# Patient Record
Sex: Female | Born: 1985 | Race: White | Hispanic: No | Marital: Single | Smoking: Never smoker
Health system: Southern US, Community
[De-identification: ages and names within clinical notes are randomized; demographics above are authoritative.]

## PROBLEM LIST (undated history)

## (undated) DIAGNOSIS — L732 Hidradenitis suppurativa: Secondary | ICD-10-CM

## (undated) DIAGNOSIS — R000 Tachycardia, unspecified: Secondary | ICD-10-CM

## (undated) DIAGNOSIS — F419 Anxiety disorder, unspecified: Secondary | ICD-10-CM

## (undated) DIAGNOSIS — I10 Essential (primary) hypertension: Secondary | ICD-10-CM

## (undated) DIAGNOSIS — E282 Polycystic ovarian syndrome: Secondary | ICD-10-CM

## (undated) DIAGNOSIS — E119 Type 2 diabetes mellitus without complications: Secondary | ICD-10-CM

## (undated) DIAGNOSIS — F4310 Post-traumatic stress disorder, unspecified: Secondary | ICD-10-CM

## (undated) DIAGNOSIS — F32A Depression, unspecified: Secondary | ICD-10-CM

## (undated) DIAGNOSIS — E039 Hypothyroidism, unspecified: Secondary | ICD-10-CM

## (undated) DIAGNOSIS — M797 Fibromyalgia: Secondary | ICD-10-CM

## (undated) DIAGNOSIS — G90A Postural orthostatic tachycardia syndrome (POTS): Secondary | ICD-10-CM

## (undated) DIAGNOSIS — E7800 Pure hypercholesterolemia, unspecified: Secondary | ICD-10-CM

## (undated) HISTORY — PX: THYROID LOBECTOMY: SHX420

---

## 2021-10-07 ENCOUNTER — Other Ambulatory Visit: Payer: Self-pay

## 2021-10-07 ENCOUNTER — Ambulatory Visit
Admission: EM | Admit: 2021-10-07 | Discharge: 2021-10-07 | Disposition: A | Discharge status: Home / Self Care | Payer: Medicare Other | Attending: Physician Assistant | Admitting: Physician Assistant

## 2021-10-07 ENCOUNTER — Ambulatory Visit (INDEPENDENT_AMBULATORY_CARE_PROVIDER_SITE_OTHER): Payer: Medicare Other

## 2021-10-07 DIAGNOSIS — Z87442 Personal history of urinary calculi: Secondary | ICD-10-CM | POA: Insufficient documentation

## 2021-10-07 DIAGNOSIS — M545 Low back pain, unspecified: Secondary | ICD-10-CM | POA: Diagnosis present

## 2021-10-07 DIAGNOSIS — R109 Unspecified abdominal pain: Secondary | ICD-10-CM | POA: Diagnosis not present

## 2021-10-07 DIAGNOSIS — R82998 Other abnormal findings in urine: Secondary | ICD-10-CM | POA: Insufficient documentation

## 2021-10-07 DIAGNOSIS — B379 Candidiasis, unspecified: Secondary | ICD-10-CM | POA: Insufficient documentation

## 2021-10-07 HISTORY — DX: Hypothyroidism, unspecified: E03.9

## 2021-10-07 HISTORY — DX: Anxiety disorder, unspecified: F41.9

## 2021-10-07 HISTORY — DX: Depression, unspecified: F32.A

## 2021-10-07 HISTORY — DX: Essential (primary) hypertension: I10

## 2021-10-07 HISTORY — DX: Type 2 diabetes mellitus without complications: E11.9

## 2021-10-07 HISTORY — DX: Postural orthostatic tachycardia syndrome (POTS): G90.A

## 2021-10-07 HISTORY — DX: Tachycardia, unspecified: R00.0

## 2021-10-07 HISTORY — DX: Post-traumatic stress disorder, unspecified: F43.10

## 2021-10-07 HISTORY — DX: Pure hypercholesterolemia, unspecified: E78.00

## 2021-10-07 HISTORY — DX: Hidradenitis suppurativa: L73.2

## 2021-10-07 HISTORY — DX: Fibromyalgia: M79.7

## 2021-10-07 HISTORY — DX: Polycystic ovarian syndrome: E28.2

## 2021-10-07 LAB — URINALYSIS, ROUTINE W REFLEX MICROSCOPIC
Bilirubin Urine: NEGATIVE
Glucose, UA: NEGATIVE mg/dL
Hgb urine dipstick: NEGATIVE
Ketones, ur: NEGATIVE mg/dL
Leukocytes,Ua: NEGATIVE
Nitrite: NEGATIVE
Protein, ur: NEGATIVE mg/dL
Specific Gravity, Urine: 1.025 (ref 1.005–1.030)
pH: 6 (ref 5.0–8.0)

## 2021-10-07 LAB — WET PREP, GENITAL
Clue Cells Wet Prep HPF POC: NONE SEEN
Sperm: NONE SEEN
Trich, Wet Prep: NONE SEEN
WBC, Wet Prep HPF POC: <10 — AB (ref <10)

## 2021-10-07 MED ORDER — CYCLOBENZAPRINE HCL 10 MG PO TABS: 10.0000 mg | ORAL_TABLET | Freq: Three times a day (TID) | ORAL | 0 refills | Status: AC | PRN

## 2021-10-07 MED ORDER — FLUCONAZOLE 150 MG PO TABS: ORAL_TABLET | ORAL | 0 refills | Status: DC

## 2021-10-07 NOTE — Discharge Instructions (Addendum)

## 2021-10-07 NOTE — ED Triage Notes (Signed)
Patient presents to Urgent Care with complaints of left lower back pain, nausea, and possible UTI. She states urinary urgency, dark cloudy urine x 1 week. Pt states hx of kidney stones. She has been on cipro for the past 5 days. Treating pain with Vicodin, last dose this morning.  ? ?Denies hematuria.  ?

## 2021-10-07 NOTE — ED Provider Notes (Signed)
?[facility] ? ? ? ?CSN: [id number] ?Arrival date & time: 10/07/21  1728 ? ? ?  ? ?History   ?Chief Complaint ?Chief Complaint  ?Patient presents with  ? Back Pain  ? ? ?HPI ?Brianna Mejia is a 36 y.o. female presenting for 1 week history of left lower back pain and feeling nauseous.  She reports some urinary urgency and dark cloudy urine.  States she contacted her PCP and let them know her symptoms and with her history of kidney stones they prescribed her an antibiotic for possible UTI.  She has taken 5-day course of Cipro.  Patient also took a Vicodin today for pain relief.  She denies any associated fevers.  The pain is constant and aching and worse when she lays down or rotates the hips or leans forward.  Pain occasionally radiates a little bit toward the left lower abdomen.  Not associated with any vomiting, diarrhea or constipation.  Patient believes she may have a kidney stone.  He says last kidney stone she had was last year.  Denies ever having to have a procedure to remove the stone.  States she is passed on her own.  Medical history significant for type 2 diabetes, fibromyalgia, hypertension, hyperlipidemia, PCOS, POTS, PTSD, depression and obesity. ? ?HPI ? ?Past Medical History:  ?Diagnosis Date  ? Anxiety   ? Depression   ? Diabetes mellitus, type 2 (HCC)   ? Fibromyalgia   ? Hidradenitis   ? High cholesterol   ? Hypertension   ? Hypothyroid   ? PCOS (polycystic ovarian syndrome)   ? POTS (postural orthostatic tachycardia syndrome)   ? PTSD (post-traumatic stress disorder)   ? Sinus tachycardia   ? ? ?There are no problems to display for this patient. ? ? ?Past Surgical History:  ?Procedure Laterality Date  ? THYROID LOBECTOMY    ? ? ?OB History   ?No obstetric history on file. ?  ? ? ? ?Home Medications   ? ?Prior to Admission medications   ?Medication Sig Start Date End Date Taking? Authorizing Provider  ?cefdinir (OMNICEF) 300 MG capsule cefdinir 300 mg capsule ? 300MG  TWICE DAILY 03/18/21   Yes [provider]  ?Cholecalciferol 1.25 MG (50000 UT) capsule cholecalciferol (vitamin D3) 1,250 mcg (50,000 unit) capsule ? TAKE 1 CAPSULE BY MOUTH ONE TIME PER WEEK 05/11/15  Yes [provider]  ?clindamycin (CLEOCIN) 300 MG capsule clindamycin HCl 300 mg capsule ? TAKE 1 CAPSULE BY MOUTH TWO TIMES A DAY. 06/18/21  Yes [provider]  ?cyanocobalamin 1000 MCG tablet cyanocobalamin (vit B-12) 1,000 mcg tablet ? TAKE 1 TABLET BY MOUTH EVERY DAY 05/11/15  Yes [provider]  ?cyclobenzaprine (FLEXERIL) 10 MG tablet Take 1 tablet (10 mg total) by mouth 3 (three) times daily as needed for up to 7 days for muscle spasms. 10/07/21 10/14/21 Yes 10/16/21, PA-C  ?esomeprazole (NEXIUM) 40 MG capsule esomeprazole magnesium 40 mg capsule,delayed release ? TAKE 1 CAPSULE BY MOUTH EVERY DAY 09/06/10  Yes [provider]  ?fluconazole (DIFLUCAN) 150 MG tablet Take 1 tab p.o. every 72 hours for yeast infection 10/07/21  Yes 10/09/21, PA-C  ?gabapentin (NEURONTIN) 300 MG capsule gabapentin 300 mg capsule ? TAKE 3 CAPSULE 3 TIMES A DAY BY ORAL ROUTE FOR 30 DAYS. 10/02/10  Yes [provider]  ?HYDROcodone-Acetaminophen (VICODIN PO)  11/19/10  Yes [provider]  ?ibuprofen (ADVIL) 600 MG tablet Take by mouth. 12/18/10  Yes [provider]  ?  levothyroxine (SYNTHROID) 125 MCG tablet levothyroxine 125 mcg tablet ? TAKE 1 TABLET BY MOUTH EVERY DAY 07/11/19  Yes [provider]  ?LORazepam (ATIVAN) 1 MG tablet lorazepam 1 mg tablet ? TAKE 1 TABLET BY MOUTH 4 TIMES A DAY FOR INCREASED STRESS ABOUT PROCEDURE 05/11/15  Yes [provider]  ?metFORMIN (GLUCOPHAGE-XR) 500 MG 24 hr tablet Take by mouth. 09/06/10  Yes [provider]  ?metoprolol tartrate (LOPRESSOR) 50 MG tablet metoprolol tartrate 50 mg tablet ? TAKE 1 AND 1/2 TABLETS BY MOUTH TWICE A DAY 11/15/10  Yes [provider]  ?Multiple Vitamin (MULTIVITAMIN ADULT  PO) Take 1 tablet by mouth daily. 04/22/05  Yes [provider]  ?promethazine (PHENERGAN) 25 MG tablet promethazine 25 mg tablet ? TAKE 1 TABLET EVERY 6 8 HOURS BY ORAL ROUTE AS NEEDED FOR 30 DAYS. 12/15/16  Yes [provider]  ?spironolactone (ALDACTONE) 100 MG tablet spironolactone 100 mg tablet ? TAKE 1 TABLET BY MOUTH EVERY DAY 03/15/20  Yes [provider]  ?aspirin 81 MG EC tablet Take by mouth.    [provider]  ?atorvastatin (LIPITOR) 20 MG tablet Take 20 mg by mouth daily. 07/21/21   [provider]  ?diltiazem (CARDIZEM CD) 180 MG 24 hr capsule Take 180 mg by mouth daily. 09/11/21   [provider]  ?ergocalciferol (VITAMIN D2) 1.25 MG (50000 UT) capsule ergocalciferol (vitamin D2) 1,250 mcg (50,000 unit) capsule ? TAKE ONE CAPSULE BY MOUTH ONE TIME PER WEEK AS DIRECTED    [provider]  ?FLUoxetine (PROZAC) 10 MG capsule fluoxetine 10 mg capsule ? TAKE 1 CAPSULE EVERY DAY WITH THE 2X20 MG CAPSULES    [provider]  ?FLUoxetine (PROZAC) 20 MG capsule Take 40 mg by mouth daily. 08/24/21   [provider]  ?Hydrocortisone, Perianal, 1 % CREA SMARTSIG:sparingly Topical Twice Daily 05/16/21   [provider]  ?losartan (COZAAR) 25 MG tablet losartan 25 mg tablet ? TAKE 1 TABLET BY MOUTH EVERY DAY    [provider]  ? ? ?Family History ?History reviewed. No pertinent family history. ? ?Social History ?Social History  ? ?Tobacco Use  ? Smoking status: Never  ? Smokeless tobacco: Never  ?Vaping Use  ? Vaping Use: Never used  ?Substance Use Topics  ? Alcohol use: Never  ? Drug use: Never  ? ? ? ?Allergies   ?Meloxicam, Milk protein, Nabumetone, and Pregabalin ? ? ?Review of Systems ?Review of Systems  ?Constitutional:  Negative for chills, fatigue and fever.  ?Gastrointestinal:  Positive for abdominal pain and nausea. Negative for diarrhea and vomiting.  ?Genitourinary:  Positive for flank pain and urgency.  Negative for decreased urine volume, dysuria, frequency, hematuria, pelvic pain, vaginal bleeding, vaginal discharge and vaginal pain.  ?Musculoskeletal:  Positive for back pain.  ?Skin:  Negative for rash.  ? ? ?Physical Exam ?Triage Vital Signs ?ED Triage Vitals  ?Enc Vitals Group  ?   BP 10/07/21 1833 (!) 155/100  ?   Pulse Rate 10/07/21 1833 85  ?   Resp 10/07/21 1833 18  ?   Temp 10/07/21 1833 98.8 ?F (37.1 ?C)  ?   Temp Source 10/07/21 1833 Oral  ?   SpO2 10/07/21 1833 96 %  ?   Weight --   ?   Height --   ?   Head Circumference --   ?   Peak Flow --   ?   Pain Score 10/07/21 1831 3  ?   Pain  Loc --   ?   Pain Edu? --   ?   Excl. in GC? --   ? ?No data found. ? ?Updated Vital Signs ?BP (!) 155/100 (BP Location: Left Arm)   Pulse 85   Temp 98.8 ?F (37.1 ?C) (Oral)   Resp 18   SpO2 96%  ? ?   ? ?Physical Exam ?Vitals and nursing note reviewed.  ?Constitutional:   ?   General: She is not in acute distress. ?   Appearance: Normal appearance. She is not ill-appearing or toxic-appearing.  ?HENT:  ?   Head: Normocephalic and atraumatic.  ?Eyes:  ?   General: No scleral icterus.    ?   Right eye: No discharge.     ?   Left eye: No discharge.  ?   Conjunctiva/sclera: Conjunctivae normal.  ?Cardiovascular:  ?   Rate and Rhythm: Normal rate and regular rhythm.  ?   Heart sounds: Normal heart sounds.  ?Pulmonary:  ?   Effort: Pulmonary effort is normal. No respiratory distress.  ?   Breath sounds: Normal breath sounds.  ?Abdominal:  ?   Palpations: Abdomen is soft.  ?   Tenderness: There is no abdominal tenderness. There is left CVA tenderness. There is no right CVA tenderness.  ?Musculoskeletal:  ?   Cervical back: Neck supple.  ?   Comments: Diffuse tenderness to palpation of the left and right paralumbar muscles and gluteus muscles.  Reduced range of motion of back due to pain and guarding.  ?Skin: ?   General: Skin is dry.  ?Neurological:  ?   General: No focal deficit present.  ?   Mental Status: She is alert.  Mental status is at baseline.  ?   Motor: No weakness.  ?   Gait: Gait normal.  ?Psychiatric:     ?   Mood and Affect: Mood normal.     ?   Behavior: Behavior normal.     ?   Thought Content: Thought content nor

## 2022-05-30 ENCOUNTER — Ambulatory Visit
Admission: EM | Admit: 2022-05-30 | Discharge: 2022-05-30 | Disposition: A | Discharge status: Home / Self Care | Payer: Medicare Other | Attending: Emergency Medicine | Admitting: Emergency Medicine

## 2022-05-30 DIAGNOSIS — R3915 Urgency of urination: Secondary | ICD-10-CM | POA: Diagnosis not present

## 2022-05-30 DIAGNOSIS — J029 Acute pharyngitis, unspecified: Secondary | ICD-10-CM | POA: Insufficient documentation

## 2022-05-30 DIAGNOSIS — J069 Acute upper respiratory infection, unspecified: Secondary | ICD-10-CM | POA: Diagnosis present

## 2022-05-30 DIAGNOSIS — Z1152 Encounter for screening for COVID-19: Secondary | ICD-10-CM | POA: Insufficient documentation

## 2022-05-30 DIAGNOSIS — B3731 Acute candidiasis of vulva and vagina: Secondary | ICD-10-CM | POA: Insufficient documentation

## 2022-05-30 DIAGNOSIS — R0982 Postnasal drip: Secondary | ICD-10-CM | POA: Insufficient documentation

## 2022-05-30 LAB — URINALYSIS, ROUTINE W REFLEX MICROSCOPIC
Glucose, UA: NEGATIVE mg/dL
Hgb urine dipstick: NEGATIVE
Ketones, ur: 15 mg/dL — AB
Nitrite: NEGATIVE
Protein, ur: 30 mg/dL — AB
Specific Gravity, Urine: 1.02 (ref 1.005–1.030)
pH: 5.5 (ref 5.0–8.0)

## 2022-05-30 LAB — SARS CORONAVIRUS 2 BY RT PCR: SARS Coronavirus 2 by RT PCR: NEGATIVE

## 2022-05-30 LAB — WET PREP, GENITAL
Clue Cells Wet Prep HPF POC: NONE SEEN
Sperm: NONE SEEN
Trich, Wet Prep: NONE SEEN
WBC, Wet Prep HPF POC: <10 (ref <10)

## 2022-05-30 LAB — URINALYSIS, MICROSCOPIC (REFLEX)

## 2022-05-30 LAB — GROUP A STREP BY PCR: Group A Strep by PCR: NOT DETECTED

## 2022-05-30 MED ORDER — PROMETHAZINE-DM 6.25-15 MG/5ML PO SYRP: 5.0000 mL | ORAL_SOLUTION | Freq: Four times a day (QID) | ORAL | 0 refills | Status: AC | PRN

## 2022-05-30 MED ORDER — FLUCONAZOLE 150 MG PO TABS: 150.0000 mg | ORAL_TABLET | Freq: Every day | ORAL | 0 refills | Status: AC

## 2022-05-30 MED ORDER — BENZONATATE 100 MG PO CAPS: 200.0000 mg | ORAL_CAPSULE | Freq: Three times a day (TID) | ORAL | 0 refills | Status: AC

## 2022-05-30 MED ORDER — IPRATROPIUM BROMIDE 0.06 % NA SOLN: 2.0000 | Freq: Four times a day (QID) | NASAL | 12 refills | Status: AC

## 2022-05-30 NOTE — ED Provider Notes (Signed)
[facility]    CSN: [id number] Arrival date & time: 05/30/22  1346      History   Chief Complaint Chief Complaint  Patient presents with   Sore Throat   Urinary Urgency    HPI Brianna Mejia is a 36 y.o. female.   HPI  36 year old female here for evaluation of respiratory urinary complaints.  Patient reports that yesterday morning she woke up with a sore throat but she has been experiencing nasal congestion with yellow nasal discharge, postnasal drip, and a nonproductive cough.  She denies any fever, ear pain, shortness of breath, or wheezing.  Additionally she has been experiencing urinary frequency for the last couple of days but she denies any dysuria, hematuria, low back pain, vaginal discharge, or vaginal itching.  She states that she recently has started swimming and she also uses the hot tub after water aerobics.  Past Medical History:  Diagnosis Date   Anxiety    Depression    Diabetes mellitus, type 2 (HCC)    Fibromyalgia    Hidradenitis    High cholesterol    Hypertension    Hypothyroid    PCOS (polycystic ovarian syndrome)    POTS (postural orthostatic tachycardia syndrome)    PTSD (post-traumatic stress disorder)    Sinus tachycardia     There are no problems to display for this patient.   Past Surgical History:  Procedure Laterality Date   THYROID LOBECTOMY      OB History   No obstetric history on file.      Home Medications    Prior to Admission medications   Medication Sig Start Date End Date Taking? Authorizing Provider  aspirin 81 MG EC tablet Take by mouth.   Yes [provider]  atorvastatin (LIPITOR) 20 MG tablet Take 20 mg by mouth daily. 07/21/21  Yes [provider]  benzonatate (TESSALON) 100 MG capsule Take 2 capsules (200 mg total) by mouth every 8 (eight) hours. 05/30/22  Yes Becky Augusta, NP  cefdinir (OMNICEF) 300 MG capsule cefdinir 300 mg capsule  300MG  TWICE DAILY 03/18/21  Yes [provider]  Cholecalciferol 1.25 MG (50000 UT) capsule cholecalciferol (vitamin D3) 1,250 mcg (50,000 unit) capsule  TAKE 1 CAPSULE BY MOUTH ONE TIME PER WEEK 05/11/15  Yes [provider]  clindamycin (CLEOCIN) 300 MG capsule clindamycin HCl 300 mg capsule  TAKE 1 CAPSULE BY MOUTH TWO TIMES A DAY. 06/18/21  Yes [provider]  cyanocobalamin 1000 MCG tablet cyanocobalamin (vit B-12) 1,000 mcg tablet  TAKE 1 TABLET BY MOUTH EVERY DAY 05/11/15  Yes [provider]  diltiazem (CARDIZEM CD) 180 MG 24 hr capsule Take 180 mg by mouth daily. 09/11/21  Yes [provider]  ergocalciferol (VITAMIN D2) 1.25 MG (50000 UT) capsule ergocalciferol (vitamin D2) 1,250 mcg (50,000 unit) capsule  TAKE ONE CAPSULE BY MOUTH ONE TIME PER WEEK AS DIRECTED   Yes [provider]  esomeprazole (NEXIUM) 40 MG capsule esomeprazole magnesium 40 mg capsule,delayed release  TAKE 1 CAPSULE BY MOUTH EVERY DAY 09/06/10  Yes [provider]  fluconazole (DIFLUCAN) 150 MG tablet Take 1 tablet (150 mg total) by mouth daily for 2 doses. 05/30/22 06/01/22 Yes 13/6/23, NP  FLUoxetine (PROZAC) 10 MG capsule fluoxetine 10 mg capsule  TAKE 1 CAPSULE EVERY DAY WITH THE 2X20 MG CAPSULES   Yes [provider]  FLUoxetine (PROZAC) 20 MG capsule Take 40 mg by mouth daily. 08/24/21  Yes [provider]  gabapentin (NEURONTIN) 300 MG capsule gabapentin 300 mg capsule  TAKE 3 CAPSULE 3 TIMES A DAY BY ORAL ROUTE FOR 30 DAYS. 10/02/10  Yes [provider]  HYDROcodone-Acetaminophen (VICODIN PO)  11/19/10  Yes [provider]  Hydrocortisone, Perianal, 1 % CREA SMARTSIG:sparingly Topical Twice Daily 05/16/21  Yes [provider]  ibuprofen (ADVIL) 600 MG tablet Take by mouth. 12/18/10  Yes [provider]  ipratropium (ATROVENT) 0.06 % nasal spray Place 2 sprays into both nostrils 4 (four) times daily. 05/30/22  Yes Becky Augusta, NP   levothyroxine (SYNTHROID) 125 MCG tablet levothyroxine 125 mcg tablet  TAKE 1 TABLET BY MOUTH EVERY DAY 07/11/19  Yes [provider]  LORazepam (ATIVAN) 1 MG tablet lorazepam 1 mg tablet  TAKE 1 TABLET BY MOUTH 4 TIMES A DAY FOR INCREASED STRESS ABOUT PROCEDURE 05/11/15  Yes [provider]  losartan (COZAAR) 25 MG tablet losartan 25 mg tablet  TAKE 1 TABLET BY MOUTH EVERY DAY   Yes [provider]  metFORMIN (GLUCOPHAGE-XR) 500 MG 24 hr tablet Take by mouth. 09/06/10  Yes [provider]  metoprolol tartrate (LOPRESSOR) 50 MG tablet metoprolol tartrate 50 mg tablet  TAKE 1 AND 1/2 TABLETS BY MOUTH TWICE A DAY 11/15/10  Yes [provider]  Multiple Vitamin (MULTIVITAMIN ADULT PO) Take 1 tablet by mouth daily. 04/22/05  Yes [provider]  promethazine (PHENERGAN) 25 MG tablet promethazine 25 mg tablet  TAKE 1 TABLET EVERY 6 8 HOURS BY ORAL ROUTE AS NEEDED FOR 30 DAYS. 12/15/16  Yes [provider]  promethazine-dextromethorphan (PROMETHAZINE-DM) 6.25-15 MG/5ML syrup Take 5 mLs by mouth 4 (four) times daily as needed. 05/30/22  Yes Becky Augusta, NP  Semaglutide,0.25 or 0.5MG /DOS, (OZEMPIC, 0.25 OR 0.5 MG/DOSE,) 2 MG/1.5ML SOPN Inject 0.5 mg every week by subcutaneous route for 30 days. 03/05/22  Yes [provider]  spironolactone (ALDACTONE) 100 MG tablet spironolactone 100 mg tablet  TAKE 1 TABLET BY MOUTH EVERY DAY 03/15/20  Yes [provider]  verapamil (CALAN-SR) 180 MG CR tablet  09/06/10  Yes [provider]  Liraglutide -Weight Management (SAXENDA) 18 MG/3ML SOPN INJECT 0.6MG  SUBCUTANEOUS DAILY X 7 DAYS, THEN 1.2MG SQ DAILY X 7DAYS, THEN 1.8 MG SQ DAILY X 7 DAYS, THEN 2.4MG  SQ DAILY    [provider]    Family History History reviewed. No pertinent family history.  Social History Social History   Tobacco Use   Smoking status: Never   Smokeless tobacco: Never  Vaping Use   Vaping Use:  Never used  Substance Use Topics   Alcohol use: Never   Drug use: Never     Allergies   Meloxicam, Milk protein, Nabumetone, Pregabalin, and Cyclobenzaprine   Review of Systems Review of Systems  Constitutional:  Negative for fever.  HENT:  Positive for congestion, postnasal drip, rhinorrhea and sore throat. Negative for ear pain.   Respiratory:  Positive for cough. Negative for shortness of breath and wheezing.   Genitourinary:  Positive for frequency and urgency. Negative for dysuria, hematuria, vaginal discharge and vaginal pain.  Musculoskeletal:  Negative for back pain.     Physical Exam Triage Vital Signs ED Triage Vitals  Enc Vitals Group     BP 05/30/22 1416 129/82     Pulse Rate 05/30/22 1416 73     Resp --      Temp 05/30/22 1416 98.8 F (37.1 C)     Temp Source 05/30/22 1416 Oral     SpO2  05/30/22 1416 98 %     Weight 05/30/22 1415 (!) 332 lb (150.6 kg)     Height 05/30/22 1415 5\' 8"  (1.727 m)     Head Circumference --      Peak Flow --      Pain Score 05/30/22 1415 2     Pain Loc --      Pain Edu? --      Excl. in GC? --    No data found.  Updated Vital Signs BP 129/82 (BP Location: Right Arm)   Pulse 73   Temp 98.8 F (37.1 C) (Oral)   Ht 5\' 8"  (1.727 m)   Wt (!) 332 lb (150.6 kg)   SpO2 98%   BMI 50.48 kg/m   Visual Acuity Right Eye Distance:   Left Eye Distance:   Bilateral Distance:    Right Eye Near:   Left Eye Near:    Bilateral Near:     Physical Exam Vitals and nursing note reviewed.  Constitutional:      Appearance: Normal appearance. She is not ill-appearing.  HENT:     Head: Normocephalic and atraumatic.     Right Ear: Tympanic membrane, ear canal and external ear normal. There is no impacted cerumen.     Left Ear: Tympanic membrane, ear canal and external ear normal. There is no impacted cerumen.     Nose: Congestion and rhinorrhea present.     Comments: Nasal mucosa is erythematous and edematous with clear discharge in  both nares.    Mouth/Throat:     Mouth: Mucous membranes are moist.     Pharynx: Oropharynx is clear. Posterior oropharyngeal erythema present. No oropharyngeal exudate.     Comments: Tonsillar pillars are benign.  Posterior oropharynx is erythematous and injected with clear postnasal drip. Cardiovascular:     Rate and Rhythm: Normal rate and regular rhythm.     Pulses: Normal pulses.     Heart sounds: Normal heart sounds. No murmur heard.    No friction rub. No gallop.  Pulmonary:     Effort: Pulmonary effort is normal.     Breath sounds: Normal breath sounds. No wheezing, rhonchi or rales.  Abdominal:     Tenderness: There is no right CVA tenderness or left CVA tenderness.  Musculoskeletal:     Cervical back: Normal range of motion and neck supple.  Lymphadenopathy:     Cervical: No cervical adenopathy.  Skin:    General: Skin is warm and dry.     Capillary Refill: Capillary refill takes less than 2 seconds.     Findings: No erythema or rash.  Neurological:     General: No focal deficit present.     Mental Status: She is alert and oriented to person, place, and time.  Psychiatric:        Mood and Affect: Mood normal.        Behavior: Behavior normal.        Thought Content: Thought content normal.        Judgment: Judgment normal.      UC Treatments / Results  Labs (all labs ordered are listed, but only abnormal results are displayed) Labs Reviewed  WET PREP, GENITAL - Abnormal; Notable for the following components:      Result Value   Yeast Wet Prep HPF POC PRESENT (*)    All other components within normal limits  URINALYSIS, ROUTINE W REFLEX MICROSCOPIC - Abnormal; Notable for the following components:   APPearance CLOUDY (*)  Bilirubin Urine SMALL (*)    Ketones, ur 15 (*)    Protein, ur 30 (*)    Leukocytes,Ua TRACE (*)    All other components within normal limits  URINALYSIS, MICROSCOPIC (REFLEX) - Abnormal; Notable for the following components:   Bacteria,  UA FEW (*)    All other components within normal limits  GROUP A STREP BY PCR  SARS CORONAVIRUS 2 BY RT PCR  URINE CULTURE    EKG   Radiology No results found.  Procedures Procedures (including critical care time)  Medications Ordered in UC Medications - No data to display  Initial Impression / Assessment and Plan / UC Course  I have reviewed the triage vital signs and the nursing notes.  Pertinent labs & imaging results that were available during my care of the patient were reviewed by me and considered in my medical decision making (see chart for details).   Patient is a nontoxic-appearing 36 year old female here for evaluation of urinary and respiratory complaints as outlined in HPI above.  Her physical exam reveals the presence of an upper respiratory infection, most likely viral given the edema and erythema of her nasal mucosa and the clear rhinorrhea and clear postnasal drip.  She reports that several of her classmates and water aerobics have tested positive for strep so I will order strep PCR.  I will also do COVID PCR.  She has no CVA tenderness on exam but has urinary urgency and frequency.  I will order a vaginal wet prep as well as a urinalysis.  Vaginal wet prep is positive for yeast but negative for trichomoniasis or clue cells.  Group A strep PCR is negative.  Urinalysis shows a cloudy appearance with small bilirubin, 15 ketones, 30 protein, and trace leukocyte esterase.  Negative for nitrites.  Reflex microscopy shows 6-10 WBCs but 11-20 squamous epithelials and few bacteria.  There is also budding yeast present.  This is a contaminated sample and I suspect that the leukocyte esterase may be a result of the budding yeast.  I will send urine for culture however.  COVID PCR is negative.  I will discharge patient home with a diagnosis of vaginal yeast infection and viral upper respiratory infection.  I will treat her symptoms with Atrovent nasal spray to help with the  congestion and postnasal drip, Tessalon Perles and Promethazine DM cough syrup help with cough and congestion, and Diflucan 150 mg tablet now and repeat in 3 days for the vaginal yeast infection.   Final Clinical Impressions(s) / UC Diagnoses   Final diagnoses:  Viral URI with cough  Vaginal yeast infection     Discharge Instructions      You tested negative for COVID today.  You have a viral upper respiratory infection.  Use the Atrovent nasal spray, 2 squirts in each nostril every 6 hours, as needed for runny nose and postnasal drip.  Use the Tessalon Perles every 8 hours during the day.  Take them with a small sip of water.  They may give you some numbness to the base of your tongue or a metallic taste in your mouth, this is normal.  Use the Promethazine DM cough syrup at bedtime for cough and congestion.  It will make you drowsy so do not take it during the day.  For your vaginal yeast infection take 1 Diflucan tablet now and repeat in 72 hours.  We are sending the urine for culture and if any infection grows out we will call you and  prescribe antibiotics at that time.  However, I think your urinary symptoms are coming from your vaginal yeast infection and the Diflucan will treat that in the meantime.  Return for reevaluation or see your primary care provider for any new or worsening symptoms.      ED Prescriptions     Medication Sig Dispense Auth. Provider   benzonatate (TESSALON) 100 MG capsule Take 2 capsules (200 mg total) by mouth every 8 (eight) hours. 21 capsule Becky Augusta, NP   fluconazole (DIFLUCAN) 150 MG tablet Take 1 tablet (150 mg total) by mouth daily for 2 doses. 2 tablet Becky Augusta, NP   ipratropium (ATROVENT) 0.06 % nasal spray Place 2 sprays into both nostrils 4 (four) times daily. 15 mL Becky Augusta, NP   promethazine-dextromethorphan (PROMETHAZINE-DM) 6.25-15 MG/5ML syrup Take 5 mLs by mouth 4 (four) times daily as needed. 118 mL Becky Augusta, NP       PDMP not reviewed this encounter.   Becky Augusta, NP 05/30/22 1555    Becky Augusta, NP 06/04/22 [phone]

## 2022-05-30 NOTE — Discharge Instructions (Addendum)
You tested negative for COVID today.  You have a viral upper respiratory infection.  Use the Atrovent nasal spray, 2 squirts in each nostril every 6 hours, as needed for runny nose and postnasal drip.  Use the Tessalon Perles every 8 hours during the day.  Take them with a small sip of water.  They may give you some numbness to the base of your tongue or a metallic taste in your mouth, this is normal.  Use the Promethazine DM cough syrup at bedtime for cough and congestion.  It will make you drowsy so do not take it during the day.  For your vaginal yeast infection take 1 Diflucan tablet now and repeat in 72 hours.  We are sending the urine for culture and if any infection grows out we will call you and prescribe antibiotics at that time.  However, I think your urinary symptoms are coming from your vaginal yeast infection and the Diflucan will treat that in the meantime.  Return for reevaluation or see your primary care provider for any new or worsening symptoms.

## 2022-05-30 NOTE — ED Triage Notes (Addendum)
Pt c/o sore throat onset yesterday, hurts to swallow onset yesterday   Pt also c/o urinary urgency x few days. Pt also reports she does swim a lot

## 2022-05-31 LAB — URINE CULTURE: Culture: NO GROWTH

## 2022-07-06 ENCOUNTER — Ambulatory Visit: Payer: Medicare Other | Admitting: Internal Medicine

## 2022-07-06 NOTE — Progress Notes (Signed)
Pt did not show for scheduled appointment.  

## 2023-12-10 IMAGING — CR DG ABDOMEN 1V
4 series · 4 of 4 positions shown · non-contrast
Comparison: None.

CLINICAL DATA: Left flank pain.

EXAM:
ABDOMEN - 1 VIEW

[abdomen kub (1 of 4)]
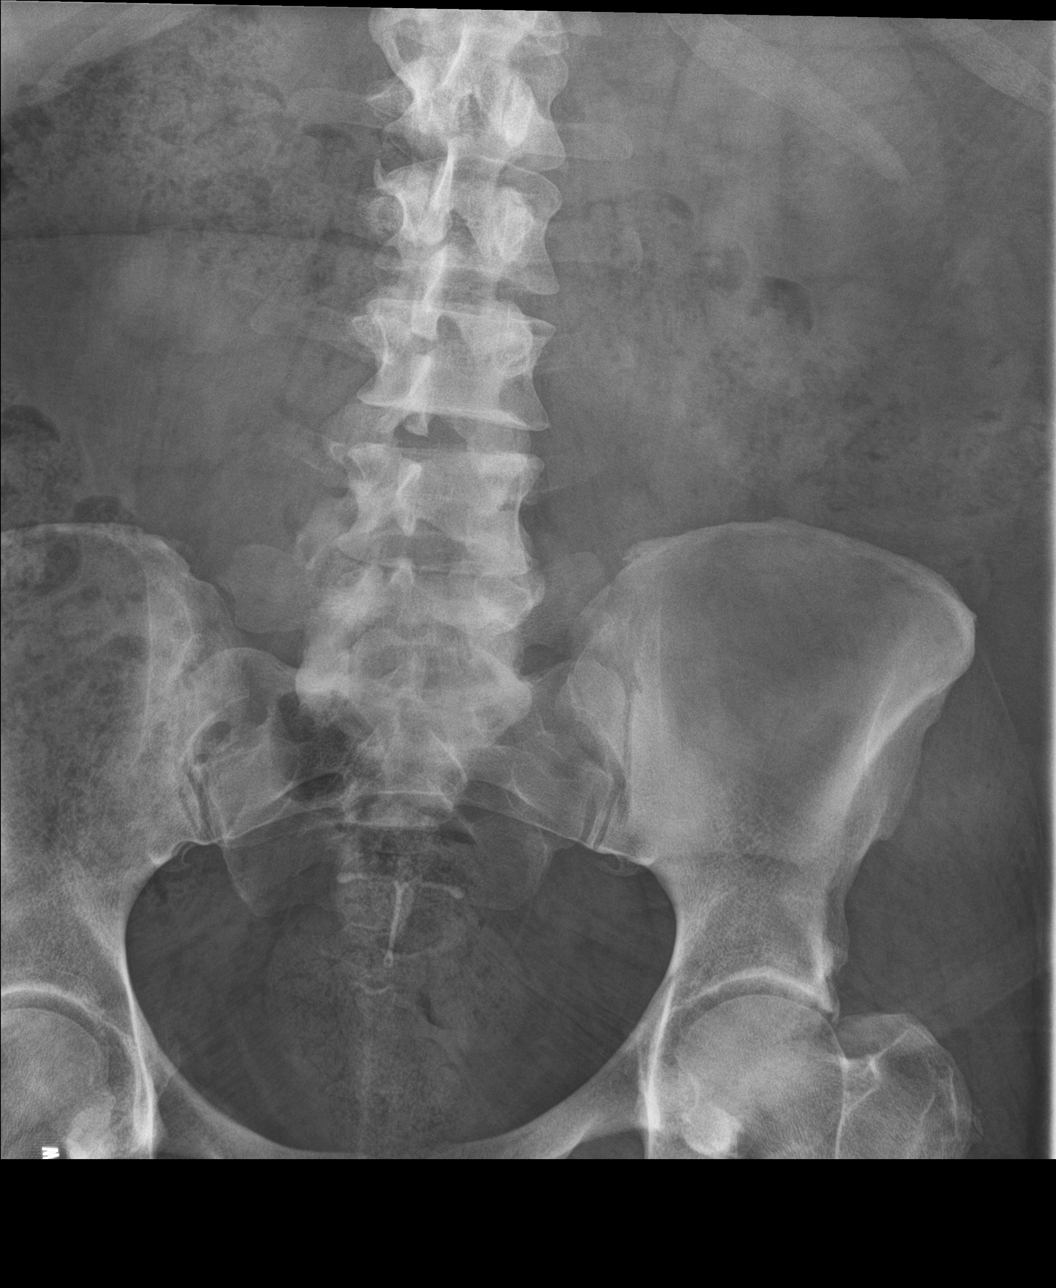

[abdomen kub (2 of 4)]
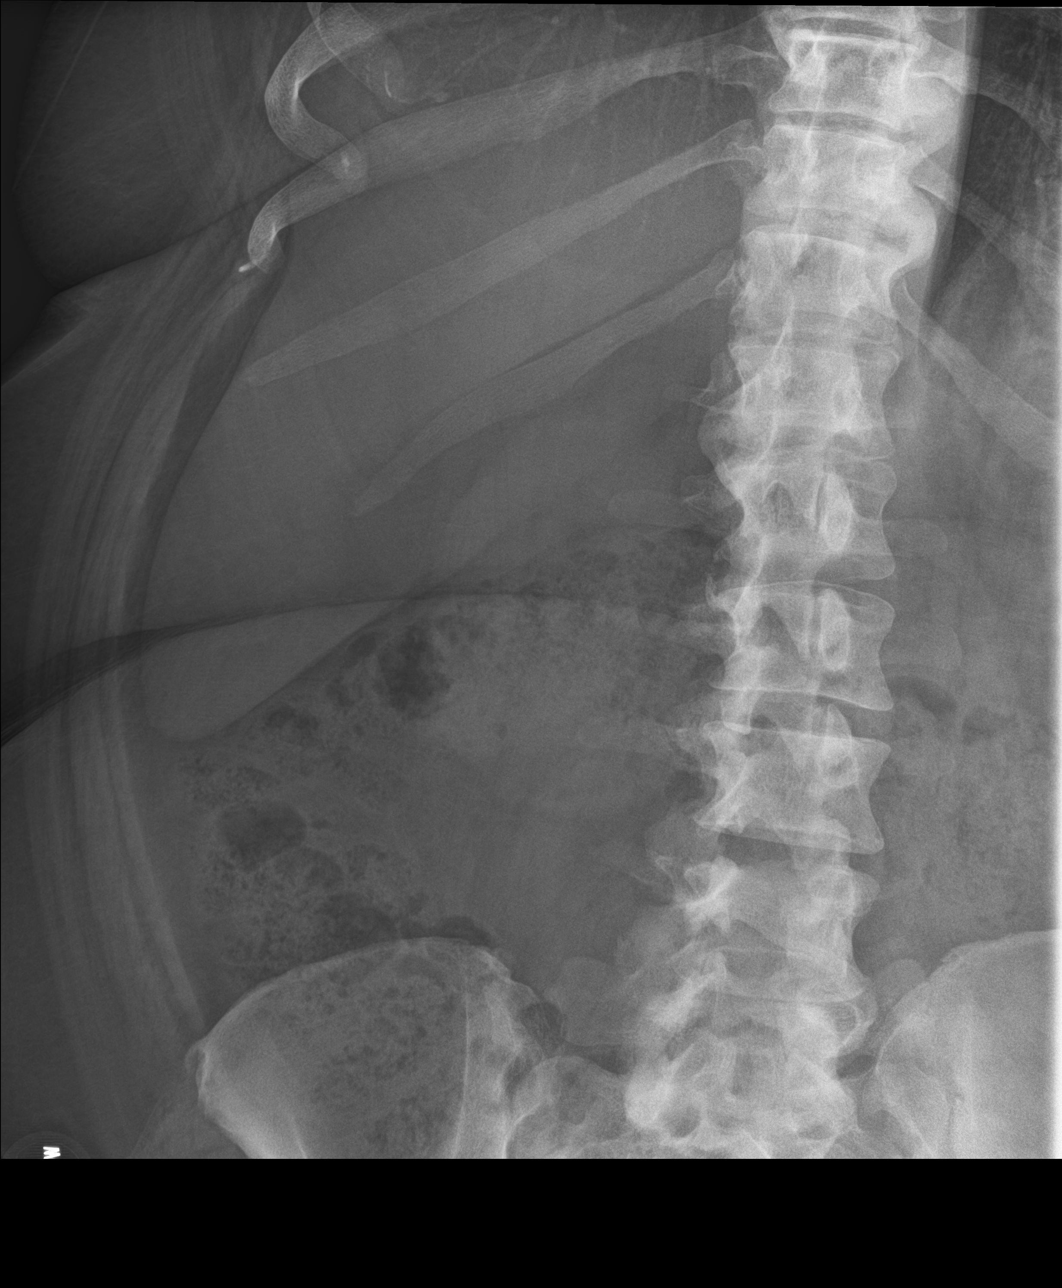

[abdomen kub (3 of 4)]
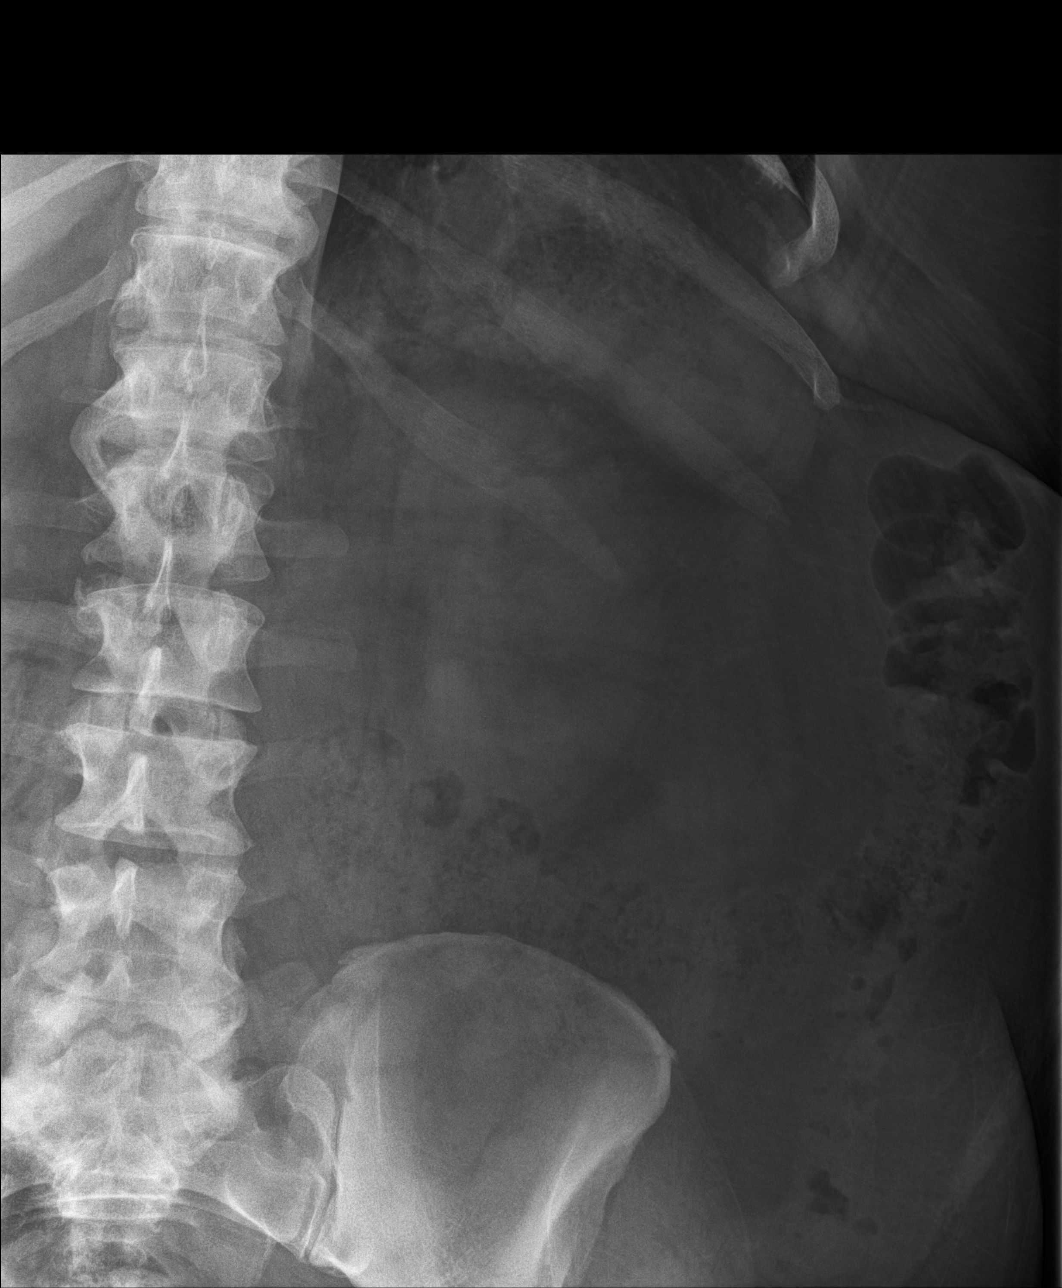

[abdomen kub (4 of 4)]
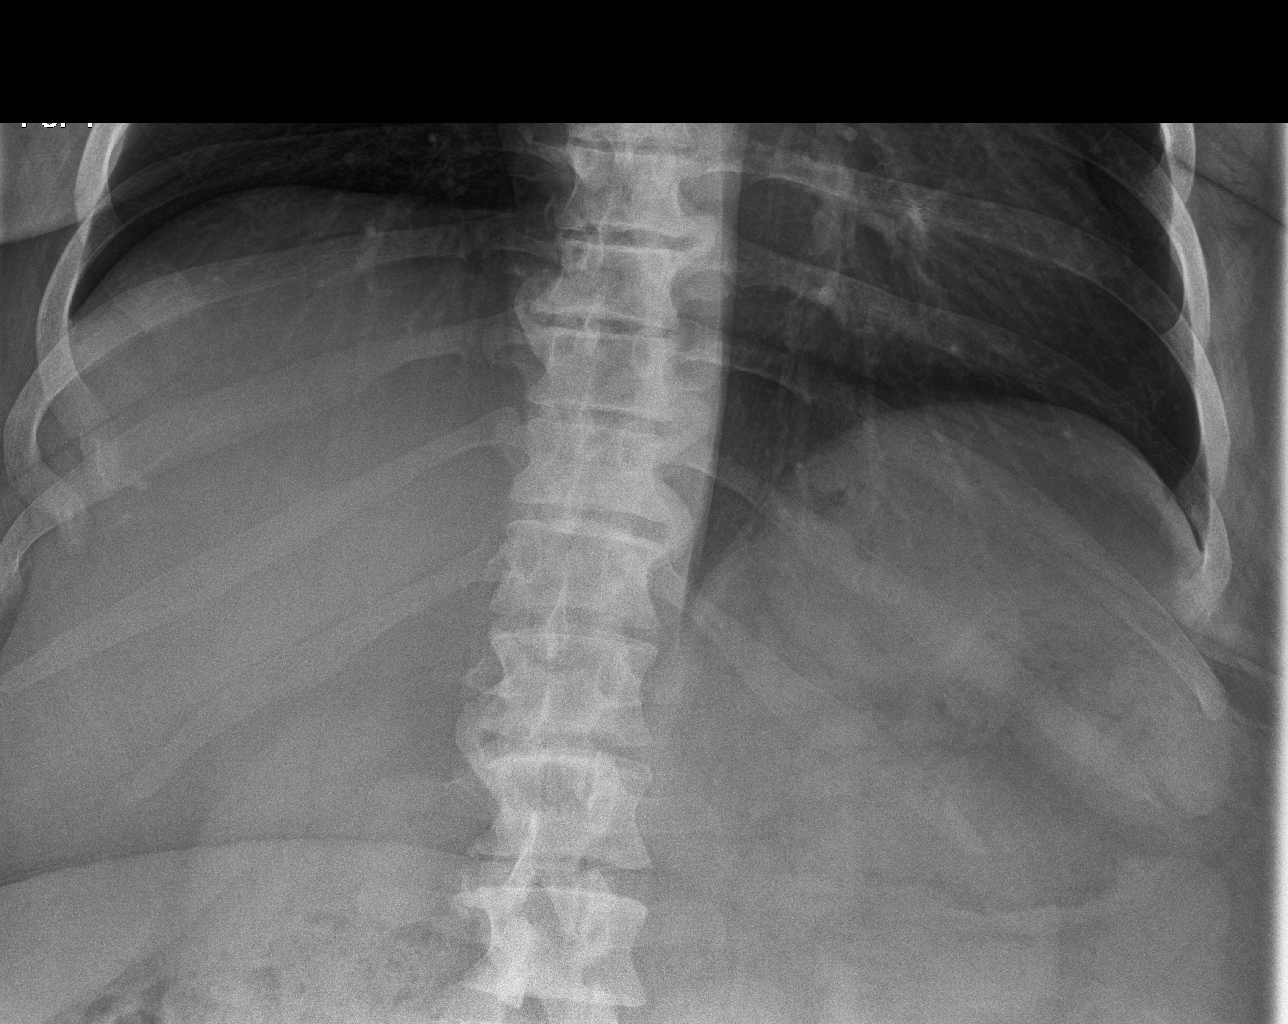

[4 of 4 positions shown; findings below may reference images not displayed]

FINDINGS: No radiopaque calculi identified.

Moderate stool throughout the colon. No bowel dilatation or evidence
of obstruction. No free air. An intrauterine device is noted. The
osseous structures are intact. The soft tissues are unremarkable.
IMPRESSION: No radiopaque calculus.
